# Patient Record
Sex: Female | Born: 2000 | Race: White | Hispanic: No | Marital: Single | Smoking: Never smoker
Health system: Southern US, Community
[De-identification: ages and names within clinical notes are randomized; demographics above are authoritative.]

## PROBLEM LIST (undated history)

## (undated) DIAGNOSIS — I499 Cardiac arrhythmia, unspecified: Secondary | ICD-10-CM

## (undated) DIAGNOSIS — I456 Pre-excitation syndrome: Secondary | ICD-10-CM

## (undated) HISTORY — PX: ABLATION: SHX5711

---
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
## 2022-07-08 ENCOUNTER — Other Ambulatory Visit: Payer: Self-pay

## 2022-07-08 MED ORDER — MEDROXYPROGESTERONE ACETATE 150 MG/ML IM SUSY
150.0000 mg | PREFILLED_SYRINGE | INTRAMUSCULAR | 5 refills | Status: AC
  Filled 2022-07-08: qty 1, 90d supply, fill #0
  Filled 2022-09-19: qty 1, 90d supply, fill #1
  Filled 2022-11-20 – 2022-11-26 (×2): qty 1, 90d supply, fill #2

## 2022-09-19 ENCOUNTER — Other Ambulatory Visit: Payer: Self-pay

## 2022-11-20 ENCOUNTER — Other Ambulatory Visit: Payer: Self-pay

## 2023-01-23 ENCOUNTER — Other Ambulatory Visit: Payer: Self-pay

## 2023-08-11 ENCOUNTER — Other Ambulatory Visit: Payer: Self-pay | Admitting: Nurse Practitioner

## 2023-08-11 DIAGNOSIS — N6321 Unspecified lump in the left breast, upper outer quadrant: Secondary | ICD-10-CM

## 2023-08-28 ENCOUNTER — Encounter: Payer: Self-pay | Admitting: Nurse Practitioner

## 2023-08-28 ENCOUNTER — Ambulatory Visit
Admission: RE | Admit: 2023-08-28 | Discharge: 2023-08-28 | Disposition: A | Discharge status: Home / Self Care | Payer: No Typology Code available for payment source | Source: Ambulatory Visit | Attending: Nurse Practitioner

## 2023-08-28 DIAGNOSIS — N6321 Unspecified lump in the left breast, upper outer quadrant: Secondary | ICD-10-CM

## 2023-08-29 ENCOUNTER — Other Ambulatory Visit: Payer: Self-pay | Admitting: Nurse Practitioner

## 2023-08-29 DIAGNOSIS — N6320 Unspecified lump in the left breast, unspecified quadrant: Secondary | ICD-10-CM

## 2023-11-07 ENCOUNTER — Other Ambulatory Visit: Payer: Self-pay | Admitting: Nurse Practitioner

## 2023-11-07 DIAGNOSIS — N6320 Unspecified lump in the left breast, unspecified quadrant: Secondary | ICD-10-CM

## 2023-11-14 ENCOUNTER — Ambulatory Visit
Admission: RE | Admit: 2023-11-14 | Discharge: 2023-11-14 | Disposition: A | Discharge status: Home / Self Care | Source: Ambulatory Visit | Attending: Nurse Practitioner | Admitting: Nurse Practitioner

## 2023-11-14 DIAGNOSIS — N6320 Unspecified lump in the left breast, unspecified quadrant: Secondary | ICD-10-CM

## 2023-11-14 HISTORY — PX: BREAST BIOPSY: SHX20

## 2023-11-17 LAB — SURGICAL PATHOLOGY
